# Patient Record
Sex: Male | Born: 1995 | Race: Black or African American | Hispanic: No | Marital: Single | Smoking: Never smoker
Health system: Southern US, Community
[De-identification: ages and names within clinical notes are randomized; demographics above are authoritative.]

---
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
## 2015-09-26 ENCOUNTER — Encounter (HOSPITAL_BASED_OUTPATIENT_CLINIC_OR_DEPARTMENT_OTHER): Payer: Self-pay

## 2015-09-26 ENCOUNTER — Emergency Department (HOSPITAL_BASED_OUTPATIENT_CLINIC_OR_DEPARTMENT_OTHER)
Admission: EM | Admit: 2015-09-26 | Discharge: 2015-09-26 | Disposition: A | Discharge status: Home / Self Care | Payer: Medicaid Other | Attending: Emergency Medicine | Admitting: Emergency Medicine

## 2015-09-26 DIAGNOSIS — G43809 Other migraine, not intractable, without status migrainosus: Secondary | ICD-10-CM | POA: Diagnosis not present

## 2015-09-26 DIAGNOSIS — H1131 Conjunctival hemorrhage, right eye: Secondary | ICD-10-CM | POA: Diagnosis not present

## 2015-09-26 DIAGNOSIS — R51 Headache: Secondary | ICD-10-CM | POA: Diagnosis present

## 2015-09-26 MED ORDER — HYDROCODONE-ACETAMINOPHEN 5-325 MG PO TABS
1.0000 | ORAL_TABLET | Freq: Once | ORAL | Status: DC
  Filled 2015-09-26: qty 1

## 2015-09-26 MED ORDER — KETOROLAC TROMETHAMINE 30 MG/ML IJ SOLN
30.0000 mg | Freq: Once | INTRAMUSCULAR | Status: AC
  Administered 2015-09-26: 30 mg via INTRAVENOUS
  Filled 2015-09-26: qty 1

## 2015-09-26 MED ORDER — BENZONATATE 100 MG PO CAPS: 100.0000 mg | ORAL_CAPSULE | Freq: Three times a day (TID) | ORAL | Status: DC

## 2015-09-26 MED ORDER — HYDROCODONE-ACETAMINOPHEN 5-325 MG PO TABS: 1.0000 | ORAL_TABLET | Freq: Once | ORAL | Status: DC

## 2015-09-26 MED ORDER — METOCLOPRAMIDE HCL 10 MG PO TABS: 10.0000 mg | ORAL_TABLET | Freq: Four times a day (QID) | ORAL | Status: DC | PRN

## 2015-09-26 MED ORDER — NAPROXEN 500 MG PO TABS: ORAL_TABLET | ORAL | Status: DC

## 2015-09-26 MED ORDER — FLUTICASONE PROPIONATE 50 MCG/ACT NA SUSP: NASAL | Status: DC

## 2015-09-26 MED ORDER — DIPHENHYDRAMINE HCL 50 MG/ML IJ SOLN
25.0000 mg | Freq: Once | INTRAMUSCULAR | Status: AC
  Administered 2015-09-26: 25 mg via INTRAVENOUS
  Filled 2015-09-26: qty 1

## 2015-09-26 MED ORDER — DIPHENHYDRAMINE HCL 25 MG PO TABS: ORAL_TABLET | ORAL | Status: DC

## 2015-09-26 MED ORDER — METOCLOPRAMIDE HCL 5 MG/ML IJ SOLN
10.0000 mg | Freq: Once | INTRAMUSCULAR | Status: AC
  Administered 2015-09-26: 10 mg via INTRAVENOUS
  Filled 2015-09-26: qty 2

## 2015-09-26 MED ORDER — SODIUM CHLORIDE 0.9 % IV BOLUS (SEPSIS)
1000.0000 mL | Freq: Once | INTRAVENOUS | Status: AC
  Administered 2015-09-26: 1000 mL via INTRAVENOUS

## 2015-09-26 NOTE — ED Notes (Signed)
Pt reports yesterday with vomiting, today with ha and redness to R eye.

## 2015-09-26 NOTE — ED Provider Notes (Signed)
CSN: [id number]     Arrival date & time 09/26/15  1828 History  By signing my name below, I, Gerald Ellison, attest that this documentation has been prepared under the direction and in the presence of Rolland Porter, MD . Electronically Signed: Marisue Ellison, Scribe. 09/26/2015. 10:33 PM.   Chief Complaint  Patient presents with  . Headache   The history is provided by the patient. No language interpreter was used.    HPI Comments:  Gerald Ellison is a 20 y.o. male who presents to the Emergency Department complaining of 8/10, throbbing, sharp headache onset two days ago. He notes the pain is exacerbated by laughing and coughing.  He was evaluated three days ago for nausea, vomiting and the same HA; states they ran blood tests, gave him nausea medication, and diagnosed him with a viral infection. Pt notes the nausea and vomiting have resolved but the HA has persisted. Pt states he has been taking OTC Tylenol with no relief. He also reports redness in eye onset after several episodes of vomiting. Pt denies any radiating pain, neck stiffness, and neck pain. He also denies a FHx of migraines.  History reviewed. No pertinent past medical history. History reviewed. No pertinent past surgical history. History reviewed. No pertinent family history. Social History  Substance Use Topics  . Smoking status: Never Smoker   . Smokeless tobacco: None  . Alcohol Use: No    Review of Systems  Constitutional: Negative for chills, diaphoresis and appetite change.  HENT: Negative for mouth sores and trouble swallowing.   Eyes: Positive for redness (right). Negative for visual disturbance.  Respiratory: Negative for chest tightness, shortness of breath and wheezing.   Cardiovascular: Negative for chest pain.  Gastrointestinal: Negative for abdominal distention.  Endocrine: Negative for polydipsia, polyphagia and polyuria.  Genitourinary: Negative for dysuria, frequency and hematuria.  Musculoskeletal:  Negative for gait problem and neck pain.  Skin: Negative for color change, pallor and rash.  Neurological: Positive for headaches. Negative for syncope and light-headedness.  Hematological: Does not bruise/bleed easily.  Psychiatric/Behavioral: Negative for behavioral problems and confusion.   Allergies  Review of patient's allergies indicates no known allergies.  Home Medications   Prior to Admission medications   Medication Sig Start Date End Date Taking? Authorizing Provider  benzonatate (TESSALON) 100 MG capsule Take 1 capsule (100 mg total) by mouth every 8 (eight) hours. 09/26/15   Rolland Porter, MD  diphenhydrAMINE (BENADRYL) 25 MG tablet Take as needed with Reglan and naproxen for migraine headache. 09/26/15   Rolland Porter, MD  fluticasone Aleda Grana) 50 MCG/ACT nasal spray 1 spray each nares twice a day 09/26/15   Rolland Porter, MD  metoCLOPramide (REGLAN) 10 MG tablet Take 1 tablet (10 mg total) by mouth every 6 (six) hours as needed for nausea (Headache. take with benadryl and Naprosyn). 09/26/15   Rolland Porter, MD  naproxen (NAPROSYN) 500 MG tablet 1 by mouth twice a day with Reglan for migraine headache 09/26/15   Rolland Porter, MD   BP 119/67 mmHg  Pulse 73  Temp(Src) 98.1 F (36.7 C) (Oral)  Resp 16  Ht  (1.778 m)  Wt 260 lb (117.935 kg)  BMI 37.31 kg/m2  SpO2 99%   Physical Exam  Constitutional: He is oriented to person, place, and time. He appears well-developed and well-nourished. No distress.  HENT:  Head: Normocephalic.  Subconjunctival hemorrhage right eye  Eyes: Conjunctivae are normal. Pupils are equal, round, and reactive to light. No scleral icterus.  Neck: Normal range of motion. Neck supple. No thyromegaly present.  Cardiovascular: Normal rate and regular rhythm.  Exam reveals no gallop and no friction rub.   No murmur heard. Pulmonary/Chest: Effort normal and breath sounds normal. No respiratory distress. He has no wheezes. He has no rales.  Abdominal: Soft. Bowel  sounds are normal. He exhibits no distension. There is no tenderness. There is no rebound.  Musculoskeletal: Normal range of motion.  Neurological: He is alert and oriented to person, place, and time.  Skin: Skin is warm and dry. No rash noted.  Psychiatric: He has a normal mood and affect. His behavior is normal.    ED Course  Procedures  DIAGNOSTIC STUDIES: Oxygen Saturation is 99% on RA, normal by my interpretation.    COORDINATION OF CARE: 9:35 PM Will administer IV fluids and meds. Discussed treatment plan with pt at bedside and pt agreed to plan.  Labs Review Labs Reviewed - No data to display  Imaging Review No results found.   EKG Interpretation None      MDM   Final diagnoses:  Other migraine without status migrainosus, not intractable   I personally performed the services described in this documentation, which was scribed in my presence. The recorded information has been reviewed and is accurate. }   Rolland Porter, MD 10/03/15 [phone]

## 2015-09-26 NOTE — ED Notes (Signed)
Vicodin given unable to scan d/t unreadable barcode, MD canceled vicodin while med ws being scanned , but then reordered

## 2015-09-26 NOTE — Discharge Instructions (Signed)

## 2016-07-31 ENCOUNTER — Emergency Department (HOSPITAL_COMMUNITY): Payer: Medicaid Other

## 2016-07-31 ENCOUNTER — Emergency Department (HOSPITAL_COMMUNITY)
Admission: EM | Admit: 2016-07-31 | Discharge: 2016-07-31 | Disposition: A | Discharge status: Home / Self Care | Payer: Medicaid Other | Attending: Emergency Medicine | Admitting: Emergency Medicine

## 2016-07-31 ENCOUNTER — Encounter (HOSPITAL_COMMUNITY): Payer: Self-pay

## 2016-07-31 DIAGNOSIS — J069 Acute upper respiratory infection, unspecified: Secondary | ICD-10-CM | POA: Insufficient documentation

## 2016-07-31 DIAGNOSIS — K2100 Gastro-esophageal reflux disease with esophagitis, without bleeding: Secondary | ICD-10-CM

## 2016-07-31 NOTE — Discharge Instructions (Signed)
Take tylenol 2 pills 4 times a day and motrin 4 pills 3 times a day.  Drink plenty of fluids.  Return for worsening shortness of breath, headache, confusion. Follow up with your family doctor.   Try zantac 150mg  twice a day.

## 2016-07-31 NOTE — ED Provider Notes (Addendum)
[facility]-EMERGENCY DEPT Provider Note   CSN: [id number] Arrival date & time: 07/31/16  1030  By signing my name below, I, Gerald Ellison, attest that this documentation has been prepared under the direction and in the presence of Gerald Planan Marque Bango, DO . Electronically Signed: Freida Busmaniana Ellison, Scribe. 07/31/2016. 12:33 PM.  History   Chief Complaint Chief Complaint  Patient presents with  . Cough  . Emesis   The history is provided by the patient. No language interpreter was used.  Cough  This is a chronic problem. The current episode started more than 1 week ago. The problem has not changed since onset.The cough is non-productive. There has been no fever. Associated symptoms include headaches and sore throat. Pertinent negatives include no chest pain, no chills, no myalgias and no shortness of breath. He has tried nothing for the symptoms. The treatment provided no relief. His past medical history does not include COPD, emphysema or asthma.  Emesis   This is a chronic problem. The current episode started more than 1 week ago. There has been no fever. Associated symptoms include cough and headaches. Pertinent negatives include no abdominal pain, no arthralgias, no chills, no diarrhea, no fever and no myalgias.  Illness  This is a new problem. The current episode started more than 1 week ago. The problem occurs constantly. The problem has not changed since onset.Associated symptoms include headaches. Pertinent negatives include no chest pain, no abdominal pain and no shortness of breath. Nothing aggravates the symptoms. Nothing relieves the symptoms. He has tried nothing for the symptoms. The treatment provided no relief.    HPI Comments:  Gerald Ellison is a 20 y.o. male who presents to the Emergency Department complaining of persistent cough and vomiting x a few months. He notes his symptoms are worse at night. He also reports associated sore throat, congestion, and mild HA.  No fever. No alleviating  factors noted.    History reviewed. No pertinent past medical history.  There are no active problems to display for this patient.   History reviewed. No pertinent surgical history.     Home Medications    Prior to Admission medications   Medication Sig Start Date End Date Taking? Authorizing Provider  benzonatate (TESSALON) 100 MG capsule Take 1 capsule (100 mg total) by mouth every 8 (eight) hours. 09/26/15   Rolland PorterMark James, MD  diphenhydrAMINE (BENADRYL) 25 MG tablet Take as needed with Reglan and naproxen for migraine headache. 09/26/15   Rolland PorterMark James, MD  fluticasone Aleda Grana(FLONASE) 50 MCG/ACT nasal spray 1 spray each nares twice a day 09/26/15   Rolland PorterMark James, MD  metoCLOPramide (REGLAN) 10 MG tablet Take 1 tablet (10 mg total) by mouth every 6 (six) hours as needed for nausea (Headache. take with benadryl and Naprosyn). 09/26/15   Rolland PorterMark James, MD  naproxen (NAPROSYN) 500 MG tablet 1 by mouth twice a day with Reglan for migraine headache 09/26/15   Rolland PorterMark James, MD    Family History No family history on file.  Social History Social History  Substance Use Topics  . Smoking status: Never Smoker  . Smokeless tobacco: Not on file  . Alcohol use No     Allergies   Patient has no known allergies.   Review of Systems Review of Systems  Constitutional: Negative for chills and fever.  HENT: Positive for congestion and sore throat. Negative for facial swelling.   Eyes: Negative for discharge and visual disturbance.  Respiratory: Positive for cough. Negative for shortness of breath.  Cardiovascular: Negative for chest pain and palpitations.  Gastrointestinal: Positive for vomiting. Negative for abdominal pain and diarrhea.  Musculoskeletal: Negative for arthralgias and myalgias.  Skin: Negative for color change and rash.  Neurological: Positive for headaches. Negative for tremors and syncope.  Psychiatric/Behavioral: Negative for confusion and dysphoric mood.  All other systems reviewed and  are negative.    Physical Exam Updated Vital Signs BP 124/56 (BP Location: Right Arm)   Pulse 92   Temp 100 F (37.8 C) (Oral)   Resp 18   SpO2 99%   Physical Exam  Constitutional: He is oriented to person, place, and time. He appears well-developed and well-nourished.  HENT:  Head: Normocephalic and atraumatic.  Swollen turbinates  Postnasal drip No sinus tenderness  Eyes: EOM are normal. Pupils are equal, round, and reactive to light.  Neck: Normal range of motion. Neck supple. No JVD present.  Cardiovascular: Normal rate and regular rhythm.  Exam reveals no gallop and no friction rub.   No murmur heard. Pulmonary/Chest: Effort normal. No respiratory distress. He has no wheezes.  Abdominal: He exhibits no distension. There is tenderness (mild epigastric). There is no rebound and no guarding.  Musculoskeletal: Normal range of motion.  Neurological: He is alert and oriented to person, place, and time.  Skin: No rash noted. No pallor.  Psychiatric: He has a normal mood and affect. His behavior is normal.  Nursing note and vitals reviewed.    ED Treatments / Results  DIAGNOSTIC STUDIES:  Oxygen Saturation is 99% on RA, normal by my interpretation.    COORDINATION OF CARE:  12:22 PM Discussed treatment plan with pt at bedside and pt agreed to plan.  Labs (all labs ordered are listed, but only abnormal results are displayed) Labs Reviewed - No data to display  EKG  EKG Interpretation  Date/Time:  Wednesday July 31 2016 10:53:19 EST Ventricular Rate:  106 PR Interval:  118 QRS Duration: 72 QT Interval:  302 QTC Calculation: 401 R Axis:   -30 Text Interpretation:  Sinus tachycardia Left axis deviation Left ventricular hypertrophy with repolarization abnormality Abnormal ECG No old tracing to compare Confirmed by Norwin Aleman MD, DANIEL [phone]) on 07/31/2016 12:24:30 PM       Radiology Dg Chest 2 View  Result Date: 07/31/2016 CLINICAL DATA:  Chest pain and  cough beginning yesterday. EXAM: CHEST  2 VIEW COMPARISON:  09/24/2004 FINDINGS: The heart size and mediastinal contours are within normal limits. Both lungs are clear. The visualized skeletal structures are unremarkable. IMPRESSION: Negative.  No active cardiopulmonary disease. Electronically Signed   By: Myles Rosenthal M.D.   On: 07/31/2016 11:23    Procedures Procedures (including critical care time)  Medications Ordered in ED Medications - No data to display   Initial Impression / Assessment and Plan / ED Course  I have reviewed the triage vital signs and the nursing notes.  Pertinent labs & imaging results that were available during my care of the patient were reviewed by me and considered in my medical decision making (see chart for details).  Clinical Course     20 yo M With a chief complaint of epigastric chest pain. This been going on for the past 2 months. Worse right after he has some imagery inlays back flat in bed. Has had some episodic vomiting with that as well. Denies fevers or chills. Has had increasing cough and congestion for the past 3 or 4 days. On my exam patient is well-appearing and nontoxic. Has a very  mild epigastric tenderness. Appears to have a URI clinically. Suspect that he has reflux disease as well based on history. We'll have him start taking Zantac. Tylenol and ibuprofen for aches and pains. Patient is tachycardic EKG with signs of LVH. I feel that he is extremely low risk for PE and much more likely to have GERD and reflux. Discharge home.  12:33 PM:  I have discussed the diagnosis/risks/treatment options with the patient and family and believe the pt to be eligible for discharge home to follow-up with PCP. We also discussed returning to the ED immediately if new or worsening sx occur. We discussed the sx which are most concerning (e.g., sudden worsening pain, fever, inability to tolerate by mouth) that necessitate immediate return. Medications administered to the  patient during their visit and any new prescriptions provided to the patient are listed below.  Medications given during this visit Medications - No data to display   The patient appears reasonably screen and/or stabilized for discharge and I doubt any other medical condition or other [facility] requiring further screening, evaluation, or treatment in the ED at this time prior to discharge.    Final Clinical Impressions(s) / ED Diagnoses   Final diagnoses:  Upper respiratory tract infection, unspecified type  Reflux esophagitis    New Prescriptions New Prescriptions   No medications on file    I personally performed the services described in this documentation, which was scribed in my presence. The recorded information has been reviewed and is accurate.     Gerald Planan Cristiano Capri, DO 07/31/16 1232    Gerald Planan Lota Leamer, DO 07/31/16 1234

## 2016-07-31 NOTE — ED Triage Notes (Signed)
Patient complains of cough, congestion, runny nose and vomiting since Saturday. States that his chest hurts worse with coughing and vomiting. Skin warm to touch on arrival, NAD

## 2016-08-02 ENCOUNTER — Encounter (HOSPITAL_BASED_OUTPATIENT_CLINIC_OR_DEPARTMENT_OTHER): Payer: Self-pay | Admitting: *Deleted

## 2016-08-02 ENCOUNTER — Emergency Department (HOSPITAL_BASED_OUTPATIENT_CLINIC_OR_DEPARTMENT_OTHER)
Admission: EM | Admit: 2016-08-02 | Discharge: 2016-08-02 | Disposition: A | Discharge status: Home / Self Care | Payer: Medicaid Other | Attending: Emergency Medicine | Admitting: Emergency Medicine

## 2016-08-02 DIAGNOSIS — J069 Acute upper respiratory infection, unspecified: Secondary | ICD-10-CM

## 2016-08-02 DIAGNOSIS — J0110 Acute frontal sinusitis, unspecified: Secondary | ICD-10-CM

## 2016-08-02 DIAGNOSIS — H6122 Impacted cerumen, left ear: Secondary | ICD-10-CM | POA: Insufficient documentation

## 2016-08-02 MED ORDER — HYDROCODONE-HOMATROPINE 5-1.5 MG/5ML PO SYRP: 5.0000 mL | ORAL_SOLUTION | Freq: Four times a day (QID) | ORAL | 0 refills | Status: DC | PRN

## 2016-08-02 MED ORDER — AZITHROMYCIN 250 MG PO TABS: 250.0000 mg | ORAL_TABLET | Freq: Every day | ORAL | 0 refills | Status: DC

## 2016-08-02 NOTE — ED Triage Notes (Signed)
C/o sorethroat and cough x 1 week. Coughing up yellow sputum. No fever. Feels congested.

## 2016-08-02 NOTE — ED Provider Notes (Signed)
[facility]   CSN: [id number] Arrival date & time: 08/02/16  [id number]     History   Chief Complaint Chief Complaint  Patient presents with  . Sore Throat    HPI Gerald Ellison is a 20 y.o. male.  The history is provided by the patient.  Sore Throat  This is a new problem. Episode onset: about 2 weeks. The problem occurs constantly. The problem has been gradually worsening. Associated symptoms comments: Nasal congestion, cough, sore throat. Coughing up some phlegm that is yellow. No fever or severe headache. Mild facial pain. No shortness of breath or wheezing. Patient states he was seen several days ago and told to take Tylenol and Zantac which has not improved his symptoms.. The symptoms are aggravated by coughing. Relieved by: His mom gave him some Tussionex which did make him feel better. He has tried acetaminophen for the symptoms. The treatment provided no relief.    History reviewed. No pertinent past medical history.  There are no active problems to display for this patient.   History reviewed. No pertinent surgical history.     Home Medications    Prior to Admission medications   Medication Sig Start Date End Date Taking? Authorizing Provider  azithromycin (ZITHROMAX) 250 MG tablet Take 1 tablet (250 mg total) by mouth daily. Take first 2 tablets together, then 1 every day until finished. 08/02/16   Gwyneth SproutWhitney Cagney Degrace, MD  HYDROcodone-homatropine (HYCODAN) 5-1.5 MG/5ML syrup Take 5 mLs by mouth every 6 (six) hours as needed for cough. 08/02/16   Gwyneth SproutWhitney Mckensey Berghuis, MD    Family History No family history on file.  Social History Social History  Substance Use Topics  . Smoking status: Never Smoker  . Smokeless tobacco: Never Used  . Alcohol use No     Allergies   Patient has no known allergies.   Review of Systems Review of Systems  All other systems reviewed and are negative.    Physical Exam Updated Vital Signs BP 140/94 (BP  Location: Left Arm)   Pulse 96   Temp 98.2 F (36.8 C) (Oral)   Resp 16   Ht 5\' 11"  (1.803 m)   Wt 276 lb (125.2 kg)   SpO2 97%   BMI 38.49 kg/m   Physical Exam  Constitutional: He is oriented to person, place, and time. He appears well-developed and well-nourished. No distress.  HENT:  Head: Normocephalic and atraumatic.  Right Ear: Tympanic membrane normal.  Nose: Mucosal edema present. Right sinus exhibits frontal sinus tenderness. Left sinus exhibits frontal sinus tenderness.  Mouth/Throat: Posterior oropharyngeal erythema present. No oropharyngeal exudate or posterior oropharyngeal edema.  Cerumen impaction of the left ear canal  Eyes: Conjunctivae and EOM are normal. Pupils are equal, round, and reactive to light.  Neck: Normal range of motion. Neck supple.  Cardiovascular: Normal rate, regular rhythm and intact distal pulses.   No murmur heard. Pulmonary/Chest: Effort normal and breath sounds normal. No respiratory distress. He has no wheezes. He has no rales.  Abdominal: Soft. He exhibits no distension. There is no tenderness. There is no rebound and no guarding.  Musculoskeletal: Normal range of motion. He exhibits no edema or tenderness.  Lymphadenopathy:    He has no cervical adenopathy.  Neurological: He is alert and oriented to person, place, and time.  Skin: Skin is warm and dry. No rash noted. No erythema.  Psychiatric: He has a normal mood and affect. His behavior is normal.  Nursing note and vitals reviewed.  ED Treatments / Results  Labs (all labs ordered are listed, but only abnormal results are displayed) Labs Reviewed - No data to display  EKG  EKG Interpretation None       Radiology Dg Chest 2 View  Result Date: 07/31/2016 CLINICAL DATA:  Chest pain and cough beginning yesterday. EXAM: CHEST  2 VIEW COMPARISON:  09/24/2004 FINDINGS: The heart size and mediastinal contours are within normal limits. Both lungs are clear. The visualized skeletal  structures are unremarkable. IMPRESSION: Negative.  No active cardiopulmonary disease. Electronically Signed   By: Myles RosenthalJohn  Stahl M.D.   On: 07/31/2016 11:23    Procedures Procedures (including critical care time)  Medications Ordered in ED Medications - No data to display   Initial Impression / Assessment and Plan / ED Course  I have reviewed the triage vital signs and the nursing notes.  Pertinent labs & imaging results that were available during my care of the patient were reviewed by me and considered in my medical decision making (see chart for details).  Clinical Course    Pt with symptoms consistent with viral URI Initially which seems to be developing into a sinusitis. Patient's symptoms of been going on for approximately 2 weeks without improvement. Has mild facial pain but no fevers. He denies any chest pain or shortness of breath. He is a nonsmoker. Well appearing here.  No signs of breathing difficulty  No signs of otitis or abnormal abdominal findings.  Patient had a chest x-ray done 2 days ago which was clear. He is mildly course on exam with pharyngeal erythema but no other significant findings. Will treat with azithromycin, cough suppressants and OTC phenylephrine or Sudafed to dry up nasal drainage    Final Clinical Impressions(s) / ED Diagnoses   Final diagnoses:  Viral upper respiratory tract infection  Subacute frontal sinusitis    New Prescriptions New Prescriptions   AZITHROMYCIN (ZITHROMAX) 250 MG TABLET    Take 1 tablet (250 mg total) by mouth daily. Take first 2 tablets together, then 1 every day until finished.   HYDROCODONE-HOMATROPINE (HYCODAN) 5-1.5 MG/5ML SYRUP    Take 5 mLs by mouth every 6 (six) hours as needed for cough.     Gwyneth SproutWhitney Analeia Ismael, MD 08/02/16 0800

## 2017-03-03 ENCOUNTER — Ambulatory Visit (HOSPITAL_COMMUNITY)
Admission: EM | Admit: 2017-03-03 | Discharge: 2017-03-03 | Disposition: A | Discharge status: Home / Self Care | Payer: Self-pay | Attending: Family Medicine | Admitting: Family Medicine

## 2017-03-03 ENCOUNTER — Ambulatory Visit (INDEPENDENT_AMBULATORY_CARE_PROVIDER_SITE_OTHER): Payer: Self-pay

## 2017-03-03 ENCOUNTER — Encounter (HOSPITAL_COMMUNITY): Payer: Self-pay | Admitting: Emergency Medicine

## 2017-03-03 DIAGNOSIS — K2100 Gastro-esophageal reflux disease with esophagitis, without bleeding: Secondary | ICD-10-CM

## 2017-03-03 DIAGNOSIS — J301 Allergic rhinitis due to pollen: Secondary | ICD-10-CM

## 2017-03-03 DIAGNOSIS — R1032 Left lower quadrant pain: Secondary | ICD-10-CM

## 2017-03-03 DIAGNOSIS — J309 Allergic rhinitis, unspecified: Secondary | ICD-10-CM

## 2017-03-03 MED ORDER — GI COCKTAIL ~~LOC~~
30.0000 mL | Freq: Once | ORAL | Status: AC
  Administered 2017-03-03: 30 mL via ORAL

## 2017-03-03 MED ORDER — OMEPRAZOLE 40 MG PO CPDR: 40.0000 mg | DELAYED_RELEASE_CAPSULE | Freq: Every day | ORAL | 0 refills | Status: AC

## 2017-03-03 MED ORDER — METHYLPREDNISOLONE 4 MG PO TBPK: ORAL_TABLET | ORAL | 0 refills | Status: DC

## 2017-03-03 MED ORDER — GI COCKTAIL ~~LOC~~
ORAL | Status: AC
  Filled 2017-03-03: qty 30

## 2017-03-03 NOTE — ED Provider Notes (Signed)
CSN: [id number]     Arrival date & time 03/03/17  1528 History   First MD Initiated Contact with Patient 03/03/17 1548     Chief Complaint  Patient presents with  . Abdominal Pain   (Consider location/radiation/quality/duration/timing/severity/associated sxs/prior Treatment) Patient c/o abdominal pain and sore throat that is occurring at night.     The history is provided by the patient.  Abdominal Pain  Pain location:  LLQ Pain quality: aching   Pain radiates to:  Does not radiate Onset quality:  Sudden Timing:  Intermittent Chronicity:  New Relieved by:  None tried   History reviewed. No pertinent past medical history. History reviewed. No pertinent surgical history. History reviewed. No pertinent family history. Social History  Substance Use Topics  . Smoking status: Never Smoker  . Smokeless tobacco: Never Used  . Alcohol use No    Review of Systems  Constitutional: Negative.   HENT: Negative.   Eyes: Negative.   Respiratory: Negative.   Cardiovascular: Negative.   Gastrointestinal: Positive for abdominal pain.  Endocrine: Negative.   Genitourinary: Negative.   Musculoskeletal: Negative.   Allergic/Immunologic: Negative.   Neurological: Negative.   Hematological: Negative.   Psychiatric/Behavioral: Negative.     Allergies  Patient has no known allergies.  Home Medications   Prior to Admission medications   Medication Sig Start Date End Date Taking? Authorizing Provider  methylPREDNISolone (MEDROL DOSEPAK) 4 MG TBPK tablet Take 6-5-4-3-2-1 po qd 03/03/17   Deatra Canter, FNP  omeprazole (PRILOSEC) 40 MG capsule Take 1 capsule (40 mg total) by mouth daily. 03/03/17   Deatra Canter, FNP   Meds Ordered and Administered this Visit   Medications  gi cocktail (Maalox,Lidocaine,Donnatal) (30 mLs Oral Given 03/03/17 1601)    BP (!) 148/97 (BP Location: Right Arm)   Pulse 94   Temp 98.8 F (37.1 C) (Oral)   Resp 18   SpO2 99%  No data  found.   Physical Exam  Constitutional: He is oriented to person, place, and time. He appears well-developed and well-nourished.  HENT:  Head: Normocephalic and atraumatic.  Eyes: Conjunctivae and EOM are normal. Pupils are equal, round, and reactive to light.  Neck: Normal range of motion. Neck supple.  Cardiovascular: Normal rate, regular rhythm and normal heart sounds.   Pulmonary/Chest: Effort normal and breath sounds normal.  Abdominal: Soft. Bowel sounds are normal.  Musculoskeletal: Normal range of motion.  Neurological: He is alert and oriented to person, place, and time.  Nursing note and vitals reviewed.   Urgent Care Course     Procedures (including critical care time)  Labs Review Labs Reviewed - No data to display  Imaging Review Dg Abd 1 View  Result Date: 03/03/2017 CLINICAL DATA:  Left upper quadrant pain. EXAM: ABDOMEN - 1 VIEW COMPARISON:  None. FINDINGS: Supine abdomen shows no gaseous bowel dilatation. Paucity of small bowel gas evident. No unexpected abdominopelvic calcification. Visualized bony anatomy unremarkable. IMPRESSION: Negative. Electronically Signed   By: Kennith Center M.D.   On: 03/03/2017 16:38     Visual Acuity Review  Right Eye Distance:   Left Eye Distance:   Bilateral Distance:    Right Eye Near:   Left Eye Near:    Bilateral Near:         MDM   1. Gastroesophageal reflux disease with esophagitis   2. Seasonal allergic rhinitis due to pollen    Prilosec 40mg  one po qd #14 Medrol dose pack as directed Continue cough medicine and  nasal spray rx'd by other urgent care.    Deatra CanterOxford, Haneen Bernales J, [place]FNP 03/03/17 [phone]

## 2017-03-03 NOTE — ED Triage Notes (Signed)
[facility] with a complaint of abdominal pain in the mornings and when he goes to work. The patient also complained of a sore throat at night time.

## 2017-12-30 ENCOUNTER — Emergency Department (HOSPITAL_BASED_OUTPATIENT_CLINIC_OR_DEPARTMENT_OTHER)
Admission: EM | Admit: 2017-12-30 | Discharge: 2017-12-30 | Disposition: A | Discharge status: Home / Self Care | Payer: Self-pay | Attending: Emergency Medicine | Admitting: Emergency Medicine

## 2017-12-30 ENCOUNTER — Other Ambulatory Visit: Payer: Self-pay

## 2017-12-30 ENCOUNTER — Encounter (HOSPITAL_BASED_OUTPATIENT_CLINIC_OR_DEPARTMENT_OTHER): Payer: Self-pay | Admitting: *Deleted

## 2017-12-30 ENCOUNTER — Emergency Department (HOSPITAL_BASED_OUTPATIENT_CLINIC_OR_DEPARTMENT_OTHER): Payer: Self-pay

## 2017-12-30 DIAGNOSIS — J069 Acute upper respiratory infection, unspecified: Secondary | ICD-10-CM | POA: Insufficient documentation

## 2017-12-30 DIAGNOSIS — Z79899 Other long term (current) drug therapy: Secondary | ICD-10-CM | POA: Insufficient documentation

## 2017-12-30 DIAGNOSIS — B9789 Other viral agents as the cause of diseases classified elsewhere: Secondary | ICD-10-CM | POA: Insufficient documentation

## 2017-12-30 LAB — RAPID STREP SCREEN (MED CTR MEBANE ONLY): Streptococcus, Group A Screen (Direct): NEGATIVE

## 2017-12-30 MED ORDER — LIDOCAINE VISCOUS 2 % MT SOLN: 15.0000 mL | OROMUCOSAL | 0 refills | Status: AC | PRN

## 2017-12-30 MED ORDER — GUAIFENESIN ER 1200 MG PO TB12: 1.0000 | ORAL_TABLET | Freq: Two times a day (BID) | ORAL | 1 refills | Status: AC | PRN

## 2017-12-30 MED ORDER — BENZONATATE 100 MG PO CAPS: 100.0000 mg | ORAL_CAPSULE | Freq: Three times a day (TID) | ORAL | 0 refills | Status: AC

## 2017-12-30 MED ORDER — FLUTICASONE PROPIONATE 50 MCG/ACT NA SUSP: 2.0000 | Freq: Every day | NASAL | 0 refills | Status: AC

## 2017-12-30 NOTE — Discharge Instructions (Signed)
Please read and follow all provided instructions.  Your diagnoses today include:  1. Viral URI with cough     Tests performed today include: Vital signs. See below for your results today.  Chest xray Strep test  Medications prescribed/advised:  1. Mucinex [Guaifenesin] as a decongestant [thin mucus - you have to be well hydrated when taking this for it to work] 2. Tylenol for fever/pain and Motrin/Ibuprofen for muscle aches 3. Flonase Steroid Nasal Spray. This does not work to maximum capability unless used daily >1-2 weeks.  4.Viscous Lidocaine - Please swish, gargle and spit. Do not swallow.  5. Cough Suppressant: Tessalon - take as directed.   Home care instructions:  An upper respiratory infection (URI) is also sometimes known as the common cold. Most people improve within 1 week, but symptoms can last up to 2 weeks. A residual cough may last even longer.   URI is most commonly caused by a virus. Viruses are NOT treated with antibiotics. You can easily spread the virus to others by oral contact. This includes kissing, sharing a glass, coughing, or sneezing. Touching your mouth or nose and then touching a surface, which is then touched by another person, can also spread the virus.   TREATMENT  Treatment is directed at relieving symptoms. There is no cure. Antibiotics are not effective, because the infection is caused by a virus, not by bacteria. Treatment may include:  Increased fluid intake. Sports drinks offer valuable electrolytes, sugars, and fluids.  Breathing heated mist or steam (vaporizer or shower).  Eating chicken soup or other clear broths, and maintaining good nutrition.  Getting plenty of rest.  Using gargles or lozenges for comfort.  Controlling fevers with ibuprofen or acetaminophen as directed by your caregiver.  Increasing usage of your inhaler if you have asthma.  Return to work when your temperature has returned to normal.   Follow-up instructions: Followup  with your primary care doctor in 4 days if your symptoms persist.  Your more than welcome to return to the emergency department if symptoms worsen or become concerning.  Return instructions:  Please return to the Emergency Department if you do not get better, if you get worse, or new symptoms OR  - Fever (temperature greater than 101.57F)  - Bleeding that does not stop with holding pressure to the area    -Severe pain (please note that you may be more sore the day after your accident)  - Chest Pain  - Difficulty breathing (worsening shortness of breath with sputum production may be a sign of pneumonia.   - Severe nausea or vomiting  - Inability to tolerate food and liquids  - Passing out  - Skin becoming red around your wounds  - Change in mental status (confusion or lethargy)  - New numbness or weakness     -You develop fever, swollen neck glands, pain with swallowing or white areas on  the back of your throat. This may be a sign of strep throat.  Please return if you have any other emergent concerns.  Additional Information:  Your vital signs today were: BP (!) 142/84 (BP Location: Right Arm)    Pulse 89    Temp 98.9 F (37.2 C) (Oral)    Resp 20    Ht  (1.803 m)    Wt (!) 147.4 kg (325 lb)    SpO2 98%    BMI 45.33 kg/m  If your blood pressure (BP) was elevated above 135/85 this visit, please have this repeated  by your doctor within one month.

## 2017-12-30 NOTE — ED Provider Notes (Signed)
[facility] EMERGENCY DEPARTMENT Provider Note   CSN: [id number] Arrival date & time: 12/30/17  1620     History   Chief Complaint Chief Complaint  Patient presents with  . URI    HPI Gerald Ellison is a 22 y.o. male no significant past medical history presents emergency department today for URI symptoms x 3 days.  Patient states that he was recently in [place] and had several sick contacts with URI symptoms.  When he came back home he noticed that he had nasal congestion, sinus pressure, postnasal drip, sore throat with associated dysphasia, nonproductive cough.  He has been taking over-the-counter ibuprofen as well as cough and cold medication for this without relief.  Patient notes that he has difficulty sleeping as he has to keep his mouth open in order to breathe and he awakes with a sore throat and dry mouth.  He denies any fever, chills, headache, visual changes, neck stiffness, rash, chest pain, shortness of breath, hemoptysis or lower leg swelling.  HPI  History reviewed. No pertinent past medical history.  There are no active problems to display for this patient.   History reviewed. No pertinent surgical history.      Home Medications    Prior to Admission medications   Medication Sig Start Date End Date Taking? Authorizing Provider  omeprazole (PRILOSEC) 40 MG capsule Take 1 capsule (40 mg total) by mouth daily. 03/03/17   Deatra Canter, FNP    Family History History reviewed. No pertinent family history.  Social History Social History   Tobacco Use  . Smoking status: Never Smoker  . Smokeless tobacco: Never Used  Substance Use Topics  . Alcohol use: No  . Drug use: No     Allergies   Patient has no known allergies.   Review of Systems Review of Systems  All other systems reviewed and are negative.    Physical Exam Updated Vital Signs BP (!) 142/84 (BP Location: Right Arm)   Pulse 89   Temp 98.9 F (37.2 C) (Oral)    Resp 20   Ht  (1.803 m)   Wt (!) 147.4 kg (325 lb)   SpO2 98%   BMI 45.33 kg/m   Physical Exam  Constitutional: He appears well-developed and well-nourished.  HENT:  Head: Normocephalic and atraumatic.  Right Ear: Tympanic membrane and external ear normal.  Left Ear: Tympanic membrane and external ear normal.  Nose: Mucosal edema and rhinorrhea present. Right sinus exhibits no maxillary sinus tenderness and no frontal sinus tenderness. Left sinus exhibits no maxillary sinus tenderness and no frontal sinus tenderness.  Mouth/Throat: Uvula is midline, oropharynx is clear and moist and mucous membranes are normal. No tonsillar exudate.  Left ear with large amount of cerumen but not occluded. The patient has normal phonation and is in control of secretions. No stridor.  Midline uvula without edema. Soft palate rises symmetrically. 1+ Tonsillar erythema without exudates. No PTA. Tongue protrusion is normal. No trismus. No creptius on neck palpation and patient has good dentition. No gingival erythema or fluctuance noted. Mucus membranes moist.   Eyes: Pupils are equal, round, and reactive to light. Right eye exhibits no discharge. Left eye exhibits no discharge. No scleral icterus.  Neck: Trachea normal. Neck supple. No spinous process tenderness present. No neck rigidity. Normal range of motion present.  No nuchal rigidity or meningismus  Cardiovascular: Normal rate, regular rhythm and intact distal pulses.  No murmur heard. Pulses:  Radial pulses are 2+ on the right side, and 2+ on the left side.       Dorsalis pedis pulses are 2+ on the right side, and 2+ on the left side.       Posterior tibial pulses are 2+ on the right side, and 2+ on the left side.  No lower extremity swelling or edema. Calves symmetric in size bilaterally.  Pulmonary/Chest: Effort normal and breath sounds normal. He exhibits no tenderness.  No increased work of breathing. No accessory muscle use. Patient is  sitting upright, speaking in full sentences without difficulty   Abdominal: Soft. Bowel sounds are normal. There is no tenderness. There is no rebound and no guarding.  Musculoskeletal: He exhibits no edema.  Lymphadenopathy:    He has no cervical adenopathy.  Neurological: He is alert.  Skin: Skin is warm and dry. No rash noted. He is not diaphoretic.  Psychiatric: He has a normal mood and affect.  Nursing note and vitals reviewed.    ED Treatments / Results  Labs (all labs ordered are listed, but only abnormal results are displayed) Labs Reviewed  RAPID STREP SCREEN ([facility] ONLY)  CULTURE, GROUP A STREP [facility])    EKG None  Radiology Dg Chest 2 View  Result Date: 12/30/2017 CLINICAL DATA:  Productive cough and throat congestion. EXAM: CHEST - 2 VIEW COMPARISON:  07/31/2016 FINDINGS: Normal heart size and mediastinal contours. No acute infiltrate or edema. No effusion or pneumothorax. No acute osseous findings. IMPRESSION: Negative chest. Electronically Signed   By: Marnee Spring M.D.   On: 12/30/2017 16:55    Procedures Procedures (including critical care time)  Medications Ordered in ED Medications - No data to display   Initial Impression / Assessment and Plan / ED Course  I have reviewed the triage vital signs and the nursing notes.  Pertinent labs & imaging results that were available during my care of the patient were reviewed by me and considered in my medical decision making (see chart for details).     22 y.o. male with nasal congestion, sinus pressure, postnasal drip, sore throat with associated dysphasia, nonproductive cough.  Exam not concerning for peritonsillar abscess or RPA.  No chest pain or shortness of breath.  Vital signs are reassuring. Pt CXR negative for acute infiltrate.  X-ray without any other active cardiopulmonary disease.  Patients symptoms are consistent with URI, likely viral etiology. Discussed that antibiotics are not indicated for  viral infections. Pt will be discharged with symptomatic treatment.  Verbalizes understanding and is agreeable with plan.  Return precautions discussed.  He is to follow with his PCP.  Pt is hemodynamically stable & in NAD prior to dc.  Final Clinical Impressions(s) / ED Diagnoses   Final diagnoses:  Viral URI with cough    ED Discharge Orders        Ordered    lidocaine (XYLOCAINE) 2 % solution  As needed     12/30/17 1737    Guaifenesin (MUCINEX MAXIMUM STRENGTH) 1200 MG TB12  Every 12 hours PRN     12/30/17 1737    fluticasone (FLONASE) 50 MCG/ACT nasal spray  Daily     12/30/17 1737    benzonatate (TESSALON) 100 MG capsule  Every 8 hours     12/30/17 1737       Princella Pellegrini 12/30/17 1737    Alvira Monday, MD 01/01/18 1324

## 2017-12-30 NOTE — ED Triage Notes (Signed)
Pt c/o URI aytmpoms x 3 days

## 2018-01-02 ENCOUNTER — Other Ambulatory Visit: Payer: Self-pay

## 2018-01-02 ENCOUNTER — Encounter (HOSPITAL_BASED_OUTPATIENT_CLINIC_OR_DEPARTMENT_OTHER): Payer: Self-pay | Admitting: *Deleted

## 2018-01-02 ENCOUNTER — Emergency Department (HOSPITAL_BASED_OUTPATIENT_CLINIC_OR_DEPARTMENT_OTHER)
Admission: EM | Admit: 2018-01-02 | Discharge: 2018-01-02 | Disposition: A | Discharge status: Home / Self Care | Payer: Medicaid Other | Attending: Emergency Medicine | Admitting: Emergency Medicine

## 2018-01-02 DIAGNOSIS — H6690 Otitis media, unspecified, unspecified ear: Secondary | ICD-10-CM

## 2018-01-02 DIAGNOSIS — J069 Acute upper respiratory infection, unspecified: Secondary | ICD-10-CM | POA: Insufficient documentation

## 2018-01-02 DIAGNOSIS — H6691 Otitis media, unspecified, right ear: Secondary | ICD-10-CM | POA: Insufficient documentation

## 2018-01-02 DIAGNOSIS — J029 Acute pharyngitis, unspecified: Secondary | ICD-10-CM

## 2018-01-02 LAB — CULTURE, GROUP A STREP (THRC)

## 2018-01-02 MED ORDER — DEXAMETHASONE SODIUM PHOSPHATE 10 MG/ML IJ SOLN
10.0000 mg | Freq: Once | INTRAMUSCULAR | Status: DC
  Filled 2018-01-02: qty 1

## 2018-01-02 MED ORDER — AMOXICILLIN-POT CLAVULANATE 875-125 MG PO TABS: 1.0000 | ORAL_TABLET | Freq: Two times a day (BID) | ORAL | 0 refills | Status: AC

## 2018-01-02 MED ORDER — ALBUTEROL SULFATE HFA 108 (90 BASE) MCG/ACT IN AERS
2.0000 | INHALATION_SPRAY | Freq: Once | RESPIRATORY_TRACT | Status: AC
  Administered 2018-01-02: 2 via RESPIRATORY_TRACT
  Filled 2018-01-02: qty 6.7

## 2018-01-02 MED ORDER — PREDNISONE 20 MG PO TABS: 20.0000 mg | ORAL_TABLET | Freq: Every day | ORAL | 0 refills | Status: AC

## 2018-01-02 MED ORDER — IBUPROFEN 800 MG PO TABS
800.0000 mg | ORAL_TABLET | Freq: Once | ORAL | Status: AC
  Administered 2018-01-02: 800 mg via ORAL
  Filled 2018-01-02: qty 1

## 2018-01-02 MED ORDER — IBUPROFEN 800 MG PO TABS: 800.0000 mg | ORAL_TABLET | Freq: Three times a day (TID) | ORAL | 0 refills | Status: AC | PRN

## 2018-01-02 NOTE — ED Provider Notes (Signed)
Emergency Department Provider Note   I have reviewed the triage vital signs and the nursing notes.   HISTORY  Chief Complaint Cough and Headache   HPI Gerald Ellison is a 22 y.o. male returns to the emergency department for evaluation of continued upper respiratory tract infection symptoms with worsening pain in the right ear and continued sore throat.  He has had continued coughing that is nonproductive.  He states he sometimes coughs so hard he vomits.  Denies any chest pain, difficulty breathing when not coughing, abdominal discomfort.  No vomiting or diarrhea.  No sick contacts.  Has been taking the medications prescribed during the last ED visit with no significant relief in symptoms.  His headache is moderate, constant, mainly right-sided.  No vision changes.  No sudden onset, maximal intensity symptoms.   History reviewed. No pertinent past medical history.  There are no active problems to display for this patient.   History reviewed. No pertinent surgical history.  Current Outpatient Rx  . Order #: [id number] Class: Print  . Order #: [id number] Class: Print  . Order #: [id number] Class: Print  . Order #: [id number] Class: Print  . Order #: [id number] Class: Print  . Order #: [id number] Class: Print  . Order #: [id number] Class: Normal  . Order #: [id number] Class: Print    Allergies Patient has no known allergies.  No family history on file.  Social History Social History   Tobacco Use  . Smoking status: Never Smoker  . Smokeless tobacco: Never Used  Substance Use Topics  . Alcohol use: No  . Drug use: No    Review of Systems  Constitutional: Positive subjective fever.  Eyes: No visual changes. ENT: Positive sore throat and right ear pain.  Cardiovascular: Denies chest pain. Respiratory: Denies shortness of breath. Positive cough.  Gastrointestinal: No abdominal pain.  No nausea, no vomiting.  No diarrhea.  No constipation. Genitourinary: Negative for  dysuria. Musculoskeletal: Negative for back pain. Skin: Negative for rash. Neurological: Negative for focal weakness or numbness. Positive HA.   10-point ROS otherwise negative.  ____________________________________________   PHYSICAL EXAM:  VITAL SIGNS: ED Triage Vitals  Enc Vitals Group     BP 01/02/18 1115 133/84     Pulse Rate 01/02/18 1115 98     Resp 01/02/18 1115 20     Temp 01/02/18 1115 98.3 F (36.8 C)     Temp Source 01/02/18 1115 Oral     SpO2 01/02/18 1115 99 %     Weight 01/02/18 1114 (!) 325 lb (147.4 kg)     Height 01/02/18 1114  (1.803 m)     Pain Score 01/02/18 1114 7   Constitutional: Alert and oriented. Well appearing and in no acute distress. Eyes: Conjunctivae are normal. PERRL. EOMI. Head: Atraumatic. Ears:  Healthy appearing ear canals. Right TM with large effusion and erythema. Normal left TM visualized but large cerumen burden.  Nose: Positive congestion/rhinnorhea. Mouth/Throat: Mucous membranes are moist.  Oropharynx with diffuse erythema. No PTA. No exudate.  Neck: No stridor.   Cardiovascular: Normal rate, regular rhythm. Good peripheral circulation. Grossly normal heart sounds.   Respiratory: Normal respiratory effort.  No retractions. Lungs CTAB. Gastrointestinal: Soft and nontender. No distention.  Musculoskeletal: No lower extremity tenderness nor edema. No gross deformities of extremities. Neurologic:  Normal speech and language. No gross focal neurologic deficits are appreciated.  Skin:  Skin is warm, dry and intact. No rash noted.  ____________________________________________  RADIOLOGY  None ____________________________________________   PROCEDURES  Procedure(s) performed:  Procedures  None ____________________________________________   INITIAL IMPRESSION / ASSESSMENT AND PLAN / ED COURSE  Pertinent labs & imaging results that were available during my care of the patient were reviewed by me and considered in my  medical decision making (see chart for details).  Patient presents to the emergency department with continued URI symptoms as described above but has worsening right ear pain.  On exam the right ear has a large effusion with erythema.  Plan on covering with amoxicillin for likely acute otitis media.  I did not repeat strep testing with the plan to treat with amoxicillin for otitis media and recent rapid strep test and culture which were negative.  I do not believe the patient requires a repeat chest x-ray at this time.  He has no hypoxemia and normal lung exam.  Plan for IM Decadron given his continued sore throat along with inhaler for a bronchospastic cough.  The patient is in no respiratory distress.  At this time, I do not feel there is any life-threatening condition present. I have reviewed and discussed all results (EKG, imaging, lab, urine as appropriate), exam findings with patient. I have reviewed nursing notes and appropriate previous records.  I feel the patient is safe to be discharged home without further emergent workup. Discussed usual and customary return precautions. Patient and family (if present) verbalize understanding and are comfortable with this plan.  Patient will follow-up with their primary care provider. If they do not have a primary care provider, information for follow-up has been provided to them. All questions have been answered.  ____________________________________________  FINAL CLINICAL IMPRESSION(S) / ED DIAGNOSES  Final diagnoses:  Viral upper respiratory tract infection  Acute otitis media, unspecified otitis media type  Sore throat     MEDICATIONS GIVEN DURING THIS VISIT:  Medications  albuterol (PROVENTIL HFA;VENTOLIN HFA) 108 (90 Base) MCG/ACT inhaler 2 puff (2 puffs Inhalation Given 01/02/18 1142)  ibuprofen (ADVIL,MOTRIN) tablet 800 mg (800 mg Oral Given 01/02/18 1141)     NEW OUTPATIENT MEDICATIONS STARTED DURING THIS VISIT:  Discharge Medication  List as of 01/02/2018 11:46 AM    START taking these medications   Details  amoxicillin-clavulanate (AUGMENTIN) 875-125 MG tablet Take 1 tablet by mouth every 12 (twelve) hours., Starting Fri 01/02/2018, Print    ibuprofen (ADVIL,MOTRIN) 800 MG tablet Take 1 tablet (800 mg total) by mouth every 8 (eight) hours as needed., Starting Fri 01/02/2018, Print    predniSONE (DELTASONE) 20 MG tablet Take 1 tablet (20 mg total) by mouth daily for 5 days., Starting Fri 01/02/2018, Until Wed 01/07/2018, Print        Note:  This document was prepared using Dragon voice recognition software and may include unintentional dictation errors.  Alona Bene, MD Emergency Medicine    Long, Arlyss Repress, MD 01/02/18 [phone]

## 2018-01-02 NOTE — Discharge Instructions (Signed)
You were seen in the ED with headache, sore throat, and right ear pain. I am starting you on an antibiotic with likely ear infection in the right. Take Motrin and needed for pain and the steroid to decrease swelling. Return to the ED with any worsening symptoms. Call the PCP listed to assist with follow up.

## 2018-01-02 NOTE — ED Triage Notes (Signed)
URI symptoms for a week. He was seen here for same and he feels worse.

## 2020-03-11 IMAGING — CR DG CHEST 2V
2 series · 2 of 2 positions shown · non-contrast
Comparison: 07/31/2016

CLINICAL DATA: Productive cough and throat congestion.

EXAM:
CHEST - 2 VIEW

[w chest pa]
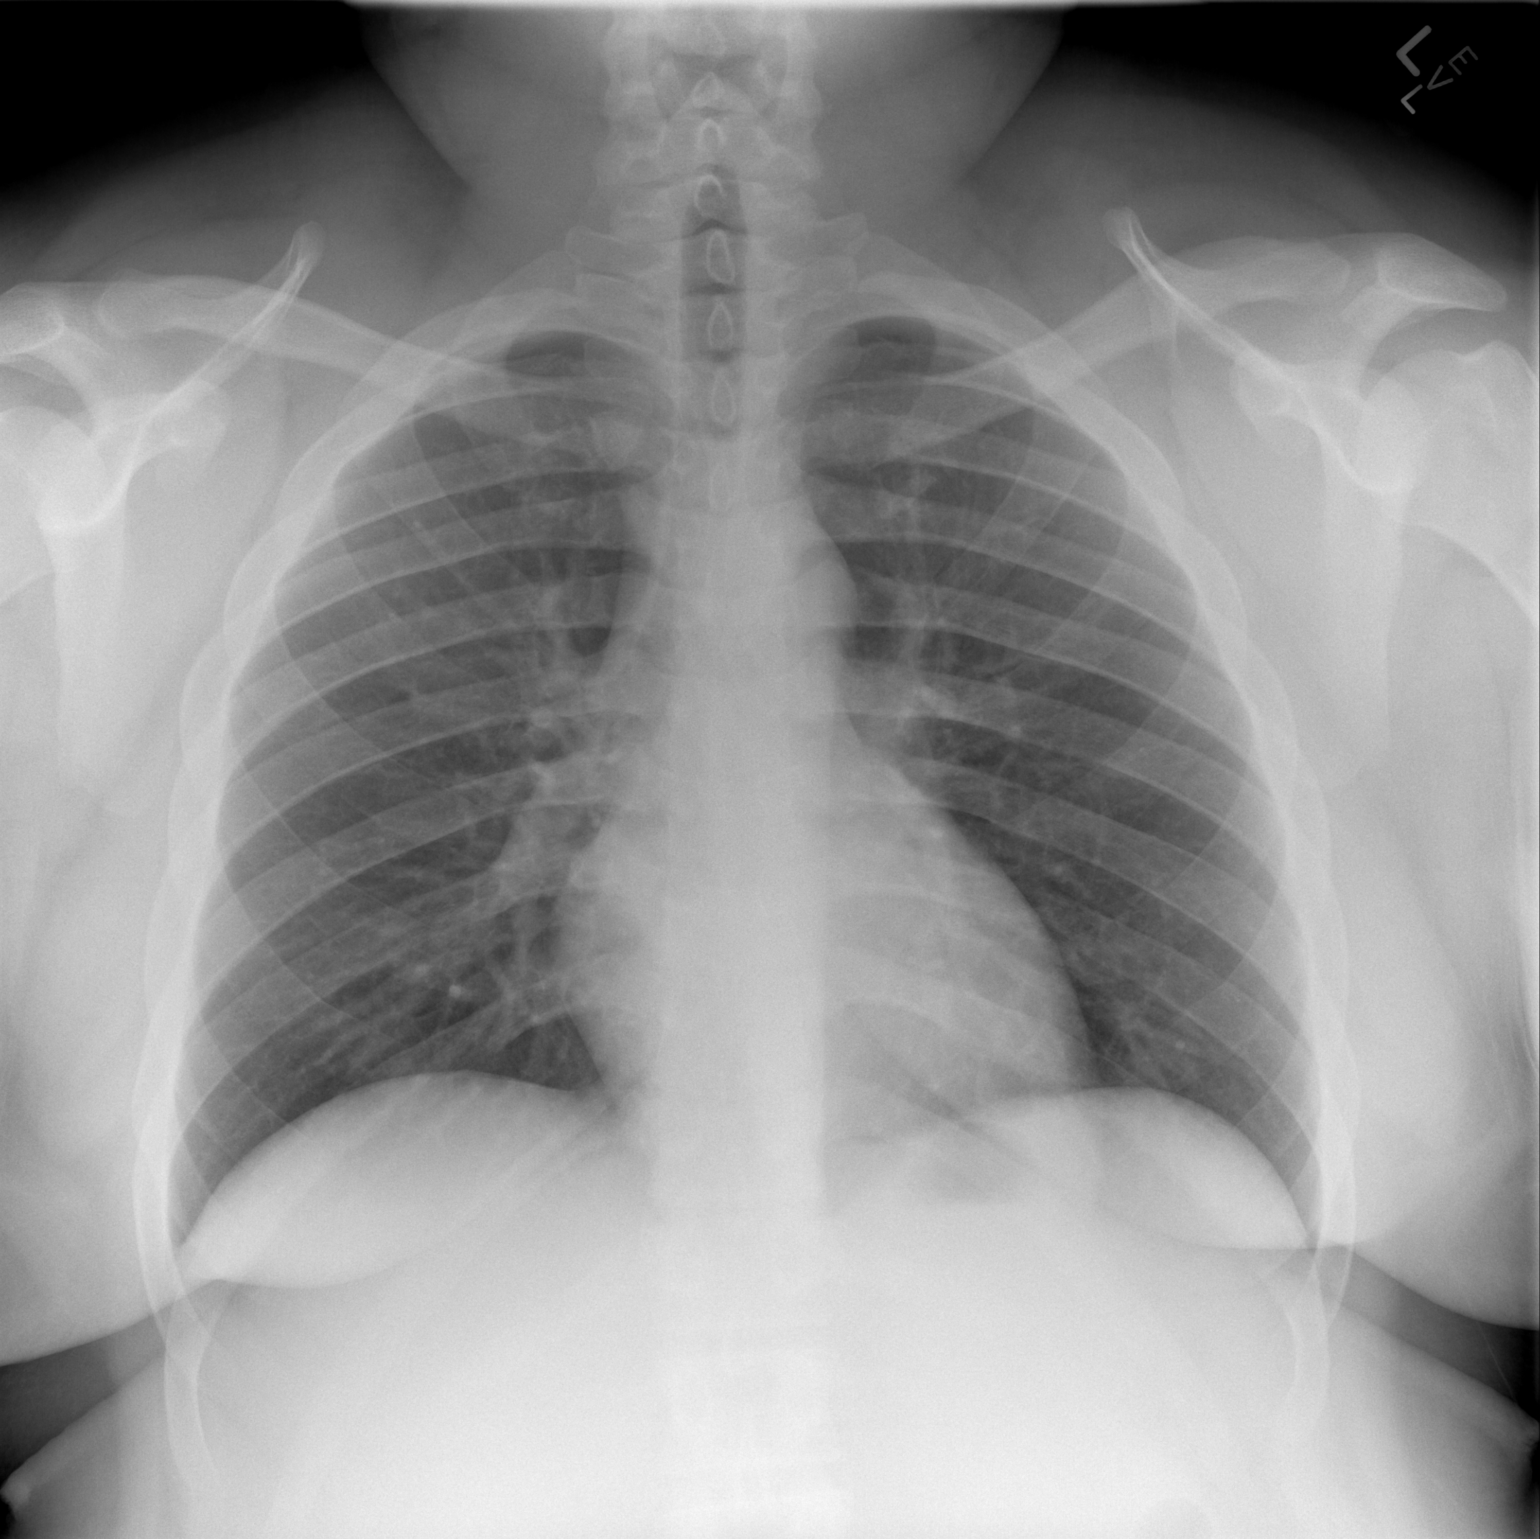

[w chest lat]
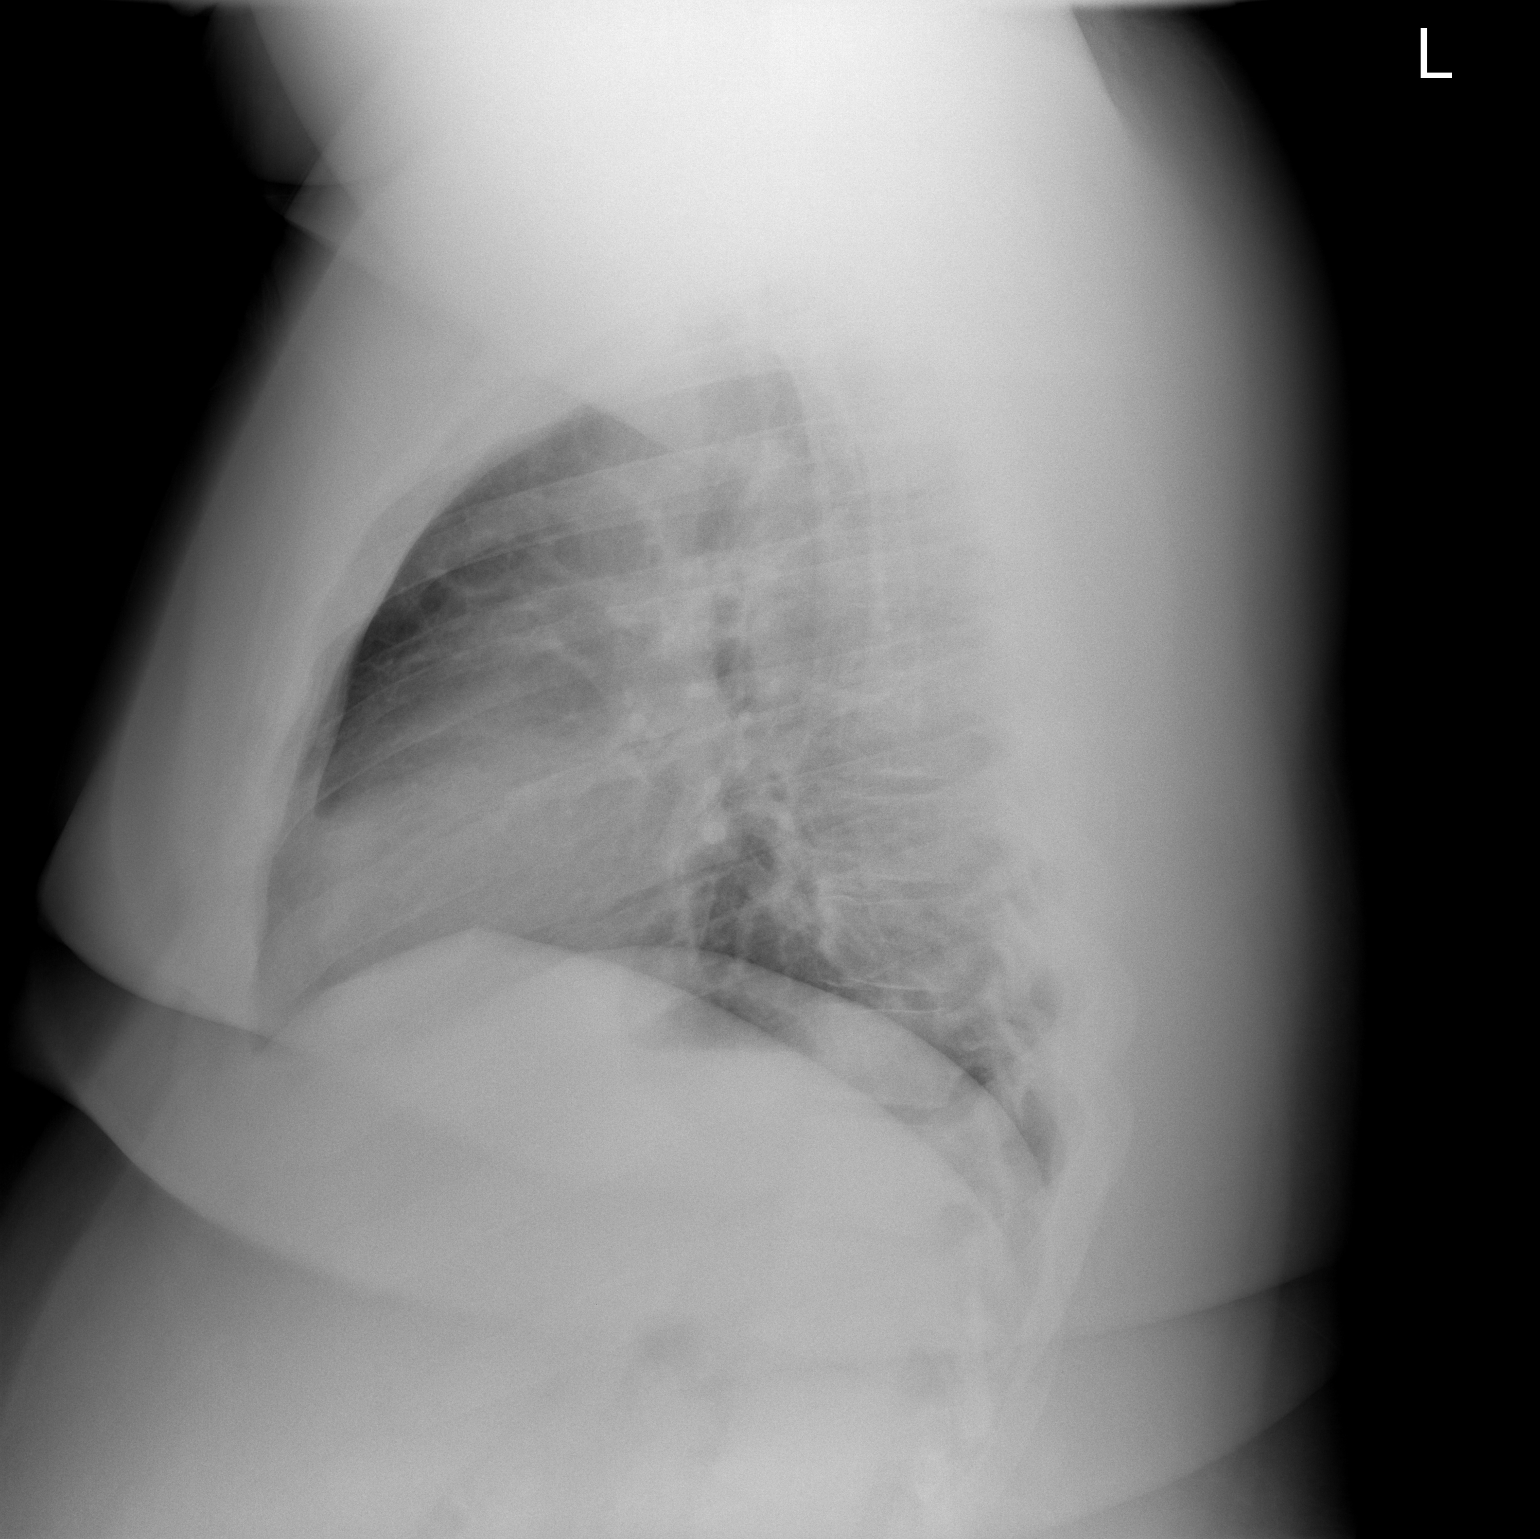

[2 of 2 positions shown; findings below may reference images not displayed]

FINDINGS: Normal heart size and mediastinal contours. No acute infiltrate or
edema. No effusion or pneumothorax. No acute osseous findings.
IMPRESSION: Negative chest.

## 2023-02-19 ENCOUNTER — Encounter (HOSPITAL_COMMUNITY): Payer: Self-pay

## 2023-02-19 ENCOUNTER — Emergency Department (HOSPITAL_COMMUNITY): Payer: Self-pay

## 2023-02-19 ENCOUNTER — Other Ambulatory Visit: Payer: Self-pay

## 2023-02-19 ENCOUNTER — Emergency Department (HOSPITAL_COMMUNITY)
Admission: EM | Admit: 2023-02-19 | Discharge: 2023-02-19 | Disposition: A | Discharge status: Home / Self Care | Payer: Self-pay | Attending: Student | Admitting: Student

## 2023-02-19 DIAGNOSIS — R519 Headache, unspecified: Secondary | ICD-10-CM | POA: Insufficient documentation

## 2023-02-19 DIAGNOSIS — M549 Dorsalgia, unspecified: Secondary | ICD-10-CM

## 2023-02-19 DIAGNOSIS — M546 Pain in thoracic spine: Secondary | ICD-10-CM | POA: Insufficient documentation

## 2023-02-19 LAB — BASIC METABOLIC PANEL
Anion gap: 13 (ref 5–15)
BUN: 12 mg/dL (ref 6–20)
CO2: 23 mmol/L (ref 22–32)
Calcium: 9.2 mg/dL (ref 8.9–10.3)
Chloride: 99 mmol/L (ref 98–111)
Creatinine, Ser: 1.05 mg/dL (ref 0.61–1.24)
GFR, Estimated: >60 mL/min (ref >60)
Glucose, Bld: 312 mg/dL — ABNORMAL HIGH (ref 70–99)
Potassium: 4.4 mmol/L (ref 3.5–5.1)
Sodium: 135 mmol/L (ref 135–145)

## 2023-02-19 LAB — CBC
HCT: 42.1 % (ref 39.0–52.0)
Hemoglobin: 15.2 g/dL (ref 13.0–17.0)
MCH: 32.2 pg (ref 26.0–34.0)
MCHC: 36.1 g/dL — ABNORMAL HIGH (ref 30.0–36.0)
MCV: 89.2 fL (ref 80.0–100.0)
Platelets: 210 10*3/uL (ref 150–400)
RBC: 4.72 MIL/uL (ref 4.22–5.81)
RDW: 12.8 % (ref 11.5–15.5)
WBC: 5.6 10*3/uL (ref 4.0–10.5)
nRBC: 0 % (ref 0.0–0.2)

## 2023-02-19 MED ORDER — PROCHLORPERAZINE EDISYLATE 10 MG/2ML IJ SOLN
10.0000 mg | Freq: Once | INTRAMUSCULAR | Status: AC
  Administered 2023-02-19: 10 mg via INTRAVENOUS
  Filled 2023-02-19: qty 2

## 2023-02-19 MED ORDER — SODIUM CHLORIDE 0.9 % IV SOLN: INTRAVENOUS | Status: DC

## 2023-02-19 MED ORDER — DEXAMETHASONE SODIUM PHOSPHATE 10 MG/ML IJ SOLN
10.0000 mg | Freq: Once | INTRAMUSCULAR | Status: DC
  Filled 2023-02-19: qty 1

## 2023-02-19 MED ORDER — SODIUM CHLORIDE 0.9 % IV BOLUS
1000.0000 mL | Freq: Once | INTRAVENOUS | Status: AC
  Administered 2023-02-19: 1000 mL via INTRAVENOUS

## 2023-02-19 MED ORDER — DIPHENHYDRAMINE HCL 50 MG/ML IJ SOLN
12.5000 mg | Freq: Once | INTRAMUSCULAR | Status: AC
  Administered 2023-02-19: 12.5 mg via INTRAVENOUS
  Filled 2023-02-19: qty 1

## 2023-02-19 MED ORDER — KETOROLAC TROMETHAMINE 15 MG/ML IJ SOLN
15.0000 mg | Freq: Once | INTRAMUSCULAR | Status: AC
  Administered 2023-02-19: 15 mg via INTRAVENOUS
  Filled 2023-02-19: qty 1

## 2023-02-19 NOTE — ED Notes (Signed)
No blood thinners 
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
## 2023-02-19 NOTE — Discharge Instructions (Signed)
Evaluation was overall reassuring.  Recommend you follow-up with your PCP.  If you have worsening headache, visual disturbance, slurred speech, facial droop, weakness or numbness in your extremities or any other concerning symptom please return emergency department for further evaluation.

## 2023-02-19 NOTE — ED Notes (Signed)
Patient refused fluids and decadron, informed him this is our normal treatment plan for his complaint, but he declined. Provider made aware.
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
## 2024-04-28 ENCOUNTER — Emergency Department (HOSPITAL_COMMUNITY)
Admission: EM | Admit: 2024-04-28 | Discharge: 2024-04-29 | Discharge status: Against Medical Advice | Payer: Self-pay | Attending: Emergency Medicine | Admitting: Emergency Medicine

## 2024-04-28 ENCOUNTER — Other Ambulatory Visit: Payer: Self-pay

## 2024-04-28 DIAGNOSIS — Z5321 Procedure and treatment not carried out due to patient leaving prior to being seen by health care provider: Secondary | ICD-10-CM | POA: Diagnosis not present

## 2024-04-28 DIAGNOSIS — Y92410 Unspecified street and highway as the place of occurrence of the external cause: Secondary | ICD-10-CM | POA: Diagnosis not present

## 2024-04-28 DIAGNOSIS — M545 Low back pain, unspecified: Secondary | ICD-10-CM | POA: Insufficient documentation

## 2024-04-28 DIAGNOSIS — R519 Headache, unspecified: Secondary | ICD-10-CM | POA: Insufficient documentation

## 2024-04-28 DIAGNOSIS — M546 Pain in thoracic spine: Secondary | ICD-10-CM | POA: Insufficient documentation
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
## 2024-04-29 NOTE — ED Notes (Signed)
 Pt name called to see provider, no response

## 2024-05-03 ENCOUNTER — Emergency Department (HOSPITAL_COMMUNITY): Admission: EM | Admit: 2024-05-03 | Discharge: 2024-05-03 | Disposition: A | Discharge status: Home / Self Care

## 2024-05-03 ENCOUNTER — Other Ambulatory Visit: Payer: Self-pay

## 2024-05-03 DIAGNOSIS — G4486 Cervicogenic headache: Secondary | ICD-10-CM

## 2024-05-03 DIAGNOSIS — M545 Low back pain, unspecified: Secondary | ICD-10-CM | POA: Diagnosis not present

## 2024-05-03 DIAGNOSIS — G8929 Other chronic pain: Secondary | ICD-10-CM | POA: Diagnosis not present

## 2024-05-03 DIAGNOSIS — R519 Headache, unspecified: Secondary | ICD-10-CM | POA: Insufficient documentation

## 2024-05-03 MED ORDER — BUTALBITAL-APAP-CAFFEINE 50-325-40 MG PO TABS
1.0000 | ORAL_TABLET | Freq: Once | ORAL | Status: AC
  Administered 2024-05-03: 1 via ORAL
  Filled 2024-05-03: qty 1

## 2024-05-03 MED ORDER — IBUPROFEN 800 MG PO TABS
800.0000 mg | ORAL_TABLET | Freq: Once | ORAL | Status: AC
  Administered 2024-05-03: 800 mg via ORAL
  Filled 2024-05-03: qty 1

## 2024-05-03 MED ORDER — LIDOCAINE 5 % EX PTCH: 1.0000 | MEDICATED_PATCH | CUTANEOUS | 0 refills | Status: AC

## 2024-05-03 MED ORDER — BUTALBITAL-APAP-CAFFEINE 50-325-40 MG PO TABS: 1.0000 | ORAL_TABLET | Freq: Four times a day (QID) | ORAL | 0 refills | Status: AC | PRN
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
## 2024-05-03 NOTE — ED Notes (Addendum)
 This RN reviewed discharge instructions with patient. He verbalized understanding and denied any further questions. PT well appearing upon discharge and reports 10/10 HA and back pain. Pt endorses ride home.
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
## 2024-05-03 NOTE — Discharge Instructions (Signed)
 You should alternate Tylenol  and Motrin  every 3 hours as needed for pain.  You can use your Fioricet  as needed.  Follow-up with primary care for your ongoing symptoms.
